# Patient Record
Sex: Male | Born: 1980 | Race: White | Hispanic: No | Marital: Single | Smoking: Current every day smoker
Health system: Southern US, Community
[De-identification: ages and names within clinical notes are randomized; demographics above are authoritative.]

## PROBLEM LIST (undated history)

## (undated) DIAGNOSIS — R05 Cough: Secondary | ICD-10-CM

## (undated) DIAGNOSIS — I8390 Asymptomatic varicose veins of unspecified lower extremity: Secondary | ICD-10-CM

## (undated) DIAGNOSIS — R059 Cough, unspecified: Secondary | ICD-10-CM

## (undated) HISTORY — DX: Cough, unspecified: R05.9

## (undated) HISTORY — DX: Asymptomatic varicose veins of unspecified lower extremity: I83.90

## (undated) HISTORY — DX: Cough: R05

## (undated) HISTORY — PX: MANDIBLE SURGERY: SHX707

## (undated) HISTORY — PX: PILONIDAL CYST EXCISION: SHX744

---

## 2002-07-13 ENCOUNTER — Inpatient Hospital Stay (HOSPITAL_COMMUNITY): Admission: RE | Admit: 2002-07-13 | Discharge: 2002-07-14 | Payer: Self-pay | Admitting: Oral Surgery

## 2003-12-28 ENCOUNTER — Emergency Department (HOSPITAL_COMMUNITY): Admission: EM | Admit: 2003-12-28 | Discharge: 2003-12-29 | Payer: Self-pay | Admitting: Emergency Medicine

## 2005-12-07 ENCOUNTER — Emergency Department (HOSPITAL_COMMUNITY): Admission: EM | Admit: 2005-12-07 | Discharge: 2005-12-07 | Payer: Self-pay | Admitting: Emergency Medicine

## 2010-09-06 ENCOUNTER — Encounter: Payer: Self-pay | Admitting: Vascular Surgery

## 2010-09-28 ENCOUNTER — Other Ambulatory Visit: Payer: Self-pay

## 2010-09-28 ENCOUNTER — Encounter: Payer: Self-pay | Admitting: Vascular Surgery

## 2010-09-28 DIAGNOSIS — I8310 Varicose veins of unspecified lower extremity with inflammation: Secondary | ICD-10-CM

## 2010-10-01 ENCOUNTER — Encounter: Payer: Self-pay | Admitting: Vascular Surgery

## 2010-10-01 ENCOUNTER — Ambulatory Visit (INDEPENDENT_AMBULATORY_CARE_PROVIDER_SITE_OTHER): Payer: BC Managed Care – PPO | Admitting: *Deleted

## 2010-10-01 ENCOUNTER — Ambulatory Visit (INDEPENDENT_AMBULATORY_CARE_PROVIDER_SITE_OTHER): Payer: BC Managed Care – PPO | Admitting: Vascular Surgery

## 2010-10-01 VITALS — BP 120/69 | HR 60 | Resp 18 | Ht 70.0 in | Wt 200.0 lb

## 2010-10-01 DIAGNOSIS — I83893 Varicose veins of bilateral lower extremities with other complications: Secondary | ICD-10-CM

## 2010-10-01 DIAGNOSIS — I8310 Varicose veins of unspecified lower extremity with inflammation: Secondary | ICD-10-CM

## 2010-10-01 NOTE — Progress Notes (Signed)
Subjective:     Patient ID: Joel Parrish, male   DOB: [birth year], 30 y.o.   MRN: [id number]  [facility], [place] male patient was referred for evaluation of varicose veins in the right leg. He states these present for 2 years. They have become increasingly large and symptomatic over the past 2 years.. He has some swelling in the right ankle as the day progresses. He has no history of DVT or thrombophlebitis. His discomfort is described as aching throbbing and burning in nature. He does not wear elastic compression stockings nor take pain medication. He has no history of bleeding or stasis ulcers. He has no symptoms in the left leg. There is a large bulge in his right ankle area which has continued to enlarge.  Past Medical History  Diagnosis Date  . Cough   . Varicose veins     History  Substance Use Topics  . Smoking status: Current Everyday Smoker    Types: Cigarettes  . Smokeless tobacco: Former Neurosurgeon    Types: Chew    Quit date: 07/01/2010  . Alcohol Use: 6.0 oz/week    10 Cans of beer per week    Family History  Problem Relation Age of Onset  . Diabetes Mother   . Hypertension Brother     Allergies  Allergen Reactions  . Penicillins     Current outpatient prescriptions:azithromycin (ZITHROMAX) 250 MG tablet, Take 250 mg by mouth daily.  , Disp: , Rfl:   BP 120/69  Pulse 60  Resp 18  Ht 5\' 10"  (1.778 m)  Wt 200 lb (90.719 kg)  BMI 28.70 kg/m2  Body mass index is 28.70 kg/(m^2).         Review of Systems he denies chest pain, dyspnea on exertion, PND, orthopnea, claudication, he has no symptoms in the complete review of systems.     Objective:   Physical Exam there are also bulging varicosities in the distal medial thigh over the great saphenous system extending into the medial calf. There is no hyperpigmentation or ulceration noted. He does have 1+ edema. Left leg is uninvolved.  Chest clear to auscultation no rhonchi or there is no DVT. Deep system has no  incontinence. HEENT exam normal for age Cardiovascular regular rhythm no murmurs Abdomen soft nontender with no masses Neurologic exam normal Skin free of rashes Lower extremity exam reveals 3+ femoral popliteal and posterior tibial pulses palpable bilaterally Right leg has a large complex of bulging varicosities proximal to the medial malleolus over about a 5 x 3 cm area.   today I ordered a venous duplex exam which are reviewed and interpreted. There is gross reflux in the right great saphenous vein from the ankle to the saphenofemoral junction.  There is no DVTAssessment:     Symptomatic varicose veins right leg secondary to gross reflux and right great saphenous system     Plan:       #1 long-leg elastic compression stockings 20-30 mm gradient #2 ibuprofen daily when necessary for pain #3 elevate legs as much as his employment we'll allow #4 return in 3 months if there's been no significant improvement blade we should proceed with laser ablation of right great saphenous vein with 10-20 stab  phlebectomy

## 2010-10-09 NOTE — Procedures (Unsigned)
LOWER EXTREMITY VENOUS REFLUX EXAM  INDICATION:  Right lower extremity varicose veins.  EXAM:  Using color-flow imaging and pulse Doppler spectral analysis, the right common femoral, superficial femoral, popliteal, posterior tibial, greater and lesser saphenous veins are evaluated.  There is evidence suggesting deep venous insufficiency in the right lower extremity.  The right saphenofemoral junction is not competent with Reflux of >560milliseconds. The right GSV is not competent with Reflux of >544milliseconds with the caliber as described below.  The right proximal short saphenous vein demonstrates competency.  GSV Diameter (used if found to be incompetent only)                                           Right    Left Proximal Greater Saphenous Vein           0.93 cm  cm Proximal-to-mid-thigh                     0.52 cm  cm Mid thigh                                 0.49 cm  cm Mid-distal thigh                          0.56 cm  cm Distal thigh                              cm       cm Knee                                      0.55 cm  cm  IMPRESSION: 1. The right greater saphenous vein is not competent with reflux     >561milliseconds. 2. The right greater saphenous vein is not tortuous. 3. The deep venous system is not competent with reflux of     >520milliseconds at the level of the common femoral vein. 4. The right small saphenous vein is competent.  ___________________________________________ Quita Skye. Hart Rochester, M.D.  EM/MEDQ  D:  10/01/2010  T:  10/01/2010  Job:  [id number]

## 2010-12-18 ENCOUNTER — Other Ambulatory Visit: Payer: Self-pay | Admitting: *Deleted

## 2010-12-18 DIAGNOSIS — I83893 Varicose veins of bilateral lower extremities with other complications: Secondary | ICD-10-CM

## 2010-12-21 ENCOUNTER — Encounter: Payer: Self-pay | Admitting: Vascular Surgery

## 2010-12-24 ENCOUNTER — Ambulatory Visit (INDEPENDENT_AMBULATORY_CARE_PROVIDER_SITE_OTHER): Payer: BC Managed Care – PPO | Admitting: Vascular Surgery

## 2010-12-24 ENCOUNTER — Encounter: Payer: Self-pay | Admitting: Vascular Surgery

## 2010-12-24 VITALS — BP 136/67 | HR 84 | Resp 18 | Ht 70.0 in | Wt 204.0 lb

## 2010-12-24 DIAGNOSIS — I83893 Varicose veins of bilateral lower extremities with other complications: Secondary | ICD-10-CM

## 2010-12-24 NOTE — Progress Notes (Signed)
Subjective:     Patient ID: Joel Parrish, male   DOB: 03/27/1980, 30 y.o.   MRN: [id number]  HPI this 30 year old male returns today for followup regarding his evaluation for painful varicosities in the right leg secondary to reflux in the right great saphenous system due to valvular incompetence. He has more long-leg elastic compression stockings 20-30 mm gradient has tried elevation and ibuprofen on a daily basis as much as his job will allow. He had no improvement in his symptomatology he continues to have aching throbbing and burning discomfort which progresses as the day progresses.  Past Medical History  Diagnosis Date  . Cough   . Varicose veins     History  Substance Use Topics  . Smoking status: Current Everyday Smoker    Types: Cigarettes  . Smokeless tobacco: Former Neurosurgeon    Types: Chew    Quit date: 07/01/2010  . Alcohol Use: 6.0 oz/week    10 Cans of beer per week    Family History  Problem Relation Age of Onset  . Diabetes Mother   . Hypertension Brother     Allergies  Allergen Reactions  . Penicillins     Current outpatient prescriptions:azithromycin (ZITHROMAX) 250 MG tablet, Take 250 mg by mouth daily.  , Disp: , Rfl:   BP 136/67  Pulse 84  Resp 18  Ht 5\' 10"  (1.778 m)  Wt 204 lb (92.534 kg)  BMI 29.27 kg/m2  Body mass index is 29.27 kg/(m^2).         Review of Systems denies chest pain, dyspnea on exertion, PND, orthopnea, hemoptysis, claudication in all other symptom     Objective:   Physical Exam blood pressure 136/67 heart rate 84 respirations 18 General he is a well-developed well-nourished male no apparent distress alert and oriented x3 Lower extremity exam reveals 3+ femoral dorsalis pedis pulse palpable the right leg. He has bulging varicosities in the distal right thigh medially over the great saphenous system extending down below the knee into the proximal calf and down to the ankle area where there is a large collection of bulging  varicosities in the posterior to the medial malleolus    Assessment:     Painful varicosities right leg secondary to reflux right great saphenous system due to valvular incompetence-affecting patient's daily living    Plan:     Proceed with laser ablation right great saphenous vein with 10-20 stab phlebectomy to be performed next week

## 2010-12-28 ENCOUNTER — Encounter: Payer: Self-pay | Admitting: Vascular Surgery

## 2010-12-31 ENCOUNTER — Encounter: Payer: Self-pay | Admitting: Vascular Surgery

## 2010-12-31 ENCOUNTER — Ambulatory Visit: Payer: BC Managed Care – PPO | Admitting: Vascular Surgery

## 2010-12-31 ENCOUNTER — Ambulatory Visit (INDEPENDENT_AMBULATORY_CARE_PROVIDER_SITE_OTHER): Payer: BC Managed Care – PPO | Admitting: Vascular Surgery

## 2010-12-31 VITALS — BP 134/77 | HR 78 | Resp 20 | Ht 70.0 in | Wt 200.0 lb

## 2010-12-31 DIAGNOSIS — I83893 Varicose veins of bilateral lower extremities with other complications: Secondary | ICD-10-CM

## 2010-12-31 NOTE — Progress Notes (Signed)
Laser Ablation Procedure      Date: 12/31/2010    Joel Parrish DOB:07/03/1980  Consent signed: Yes  Surgeon:J.D. Hart Rochester  Procedure: Laser Ablation: right Greater Saphenous Vein  BP 134/77  Pulse 78  Resp 20  Ht 5\' 10"  (1.778 m)  Wt 200 lb (90.719 kg)  BMI 28.70 kg/m2  Start time: 11:00   End time: 12:15  Tumescent Anesthesia: 400 cc 0.9% NaCl with 50 cc Lidocaine HCL with 1% Epi and 15 cc 8.4% NaHCO3  Local Anesthesia: 11 cc Lidocaine HCL and NaHCO3 (ratio 2:1)  Pulsed mode: 15 watts, 1 sec, 1 pulse. Total Pulses: 113 Total energy: 1671 Total time: 1:51       Stab Phlebectomy: 10-20 Sites: Calf and ankle  Patient tolerated procedure well: Yes   Description of Procedure:  After marking the course of the saphenous vein and the secondary varicosities in the standing position, the patient was placed on the operating table in the prone position, and the right leg was prepped and draped in sterile fashion. Local anesthetic was administered, and under ultrasound guidance the saphenous vein was accessed with a micro needle and guide wire; then the micro puncture sheath was placed. A guide wire was inserted to the saphenofemoral junction, followed by a 5 french sheath.  The position of the sheath and then the laser fiber below the junction was confirmed using the ultrasound and visualization of the aiming beam.  Tumescent anesthesia was administered along the course of the saphenous vein using ultrasound guidance. Protective laser glasses were placed on the patient, and the laser was fired at 15 watt pulsed mode advancing 1-2 mm per sec.  For a total of 1671 joules.  A steri strip was applied to the puncture site.  The patient was then put into Trendelenburg position.  Local anesthetic was utilized overlying the marked varicosities.  Greater than 10-20 stab wounds were made using the tip of an 11 blade; and using the vein hook,  The phlebectomies were performed using a hemostat to  avulse these varicosities.  Adequate hemostasis was achieved, and steri strips were applied to the stab wound.    ABD pads and thigh high compression stockings were applied.  Ace wrap bandages were applied over the phlebectomy sites and at the top of the saphenofemoral junction.  Blood loss was less than 15 cc.  The patient ambulated out of the operating room having tolerated the procedure well.

## 2010-12-31 NOTE — Progress Notes (Signed)
Subjective:     Patient ID: Joel Parrish, male   DOB: Dec 27, 1980, 30 y.o.   MRN: [id number]  HPI is 30 year old male patient had laser ablation of the right great saphenous vein from the knee to the saphenofemoral junction +10-20 stab phlebectomy of painful varicosities in the proximal and distal calf and ankle area. This was done under local tumescent anesthesia and he tolerated the procedure well. 1671 J of energy was utilized.   Review of Systems     Objective:   Physical ExamBP 134/77  Pulse 78  Resp 20  Ht 5\' 10"  (1.778 m)  Wt 200 lb (90.719 kg)  BMI 28.70 kg/m2    Assessment:    well-tolerated laser ablation right great saphenous vein with 10-20 stab phlebectomy for painful varicosities    Plan:     Return in one week for venous duplex exam to confirm closure right great saphenous vein

## 2011-01-01 ENCOUNTER — Other Ambulatory Visit: Payer: Self-pay | Admitting: *Deleted

## 2011-01-01 ENCOUNTER — Telehealth: Payer: Self-pay | Admitting: *Deleted

## 2011-01-01 DIAGNOSIS — I83893 Varicose veins of bilateral lower extremities with other complications: Secondary | ICD-10-CM

## 2011-01-01 NOTE — Telephone Encounter (Signed)
01/01/2011  Time: 11:09 AM   Patient Name: Joel Parrish  Patient of: J.D. Hart Rochester  Procedure:Laser Ablation right  Reached patient at home and checked  His status  Yes    Comments/Actions Taken: Patient doing well. No pain and no bleeding. Following all instructions. Reminded him of his fu appt on 01/07/11

## 2011-01-04 ENCOUNTER — Encounter: Payer: Self-pay | Admitting: Vascular Surgery

## 2011-01-07 ENCOUNTER — Ambulatory Visit (INDEPENDENT_AMBULATORY_CARE_PROVIDER_SITE_OTHER): Payer: BC Managed Care – PPO | Admitting: Vascular Surgery

## 2011-01-07 ENCOUNTER — Other Ambulatory Visit (INDEPENDENT_AMBULATORY_CARE_PROVIDER_SITE_OTHER): Payer: BC Managed Care – PPO | Admitting: *Deleted

## 2011-01-07 ENCOUNTER — Encounter: Payer: Self-pay | Admitting: Vascular Surgery

## 2011-01-07 VITALS — BP 128/72 | HR 65 | Resp 18 | Ht 70.0 in | Wt 200.0 lb

## 2011-01-07 DIAGNOSIS — I83893 Varicose veins of bilateral lower extremities with other complications: Secondary | ICD-10-CM

## 2011-01-07 NOTE — Progress Notes (Signed)
Subjective:     Patient ID: Joel Parrish, male   DOB: July 23, 1980, 30 y.o.   MRN: [id number]  HPI this healthy 30 year old male returns 1 week post laser ablation right great saphenous vein and 10-20 stab phlebectomy for painful varicosities in the medial thigh calf and ankle area. He has had some mild discomfort along the course of the medial thigh where the laser ablation was performed. He has had no distal edema. He denies any chest pain, dyspnea on exertion, PND, orthopnea, or hemoptysis. He has worn his elastic compression stocking as recommended and taken ibuprofen.  Review of Systems     Objective:   Physical ExamBP 128/72  Pulse 65  Resp 18  Ht 5\' 10"  (1.778 m)  Wt 200 lb (90.719 kg)  BMI 28.70 kg/m2 Chest no rhonchi or wheezing next right lower extremity exam reveals 3+ dorsalis pedis and posterior tibial pulse palpable. The stab phlebectomy wounds are healing nicely. There is no distal edema. The bulging varicosities are gone. There is mild discomfort along the course of the great saphenous vein from the knee to the groin area.    Assessment:     Today I ordered a venous duplex exam which I reviewed and interpreted. Great saphenous vein is closed from the knee to near the saphenofemoral junction and there is no DVT   successful ablation right great saphenous vein with multiple stab phlebectomy Plan:     Return to see [place] on a when necessary basis

## 2011-01-07 NOTE — Progress Notes (Signed)
One week fu laser ablation and stab phlebectomies

## 2011-01-09 ENCOUNTER — Encounter: Payer: Self-pay | Admitting: Vascular Surgery

## 2011-01-16 NOTE — Procedures (Unsigned)
DUPLEX DEEP VENOUS EXAM - LOWER EXTREMITY  INDICATION:  Right great saphenous vein laser ablation.  HISTORY:  Edema:  No. Trauma/Surgery:  Right greater saphenous vein laser ablation on 12/31/2010. Pain:  Yes. PE:  No. Previous DVT:  No. Anticoagulants: Other:  DUPLEX EXAM:               CFV   SFV   PopV  PTV    GSV               R  L  R  L  R  L  R   L  R  L Thrombosis    o  o  o     o     o      + Spontaneous   +  +  +     +     +      0 Phasic        +  +  +     +     +      0 Augmentation  +  +  +     +     +      0 Compressible  +  +  +     +     +      0 Competent     0  0  +     +     +      0  Legend:  + - yes  o - no  p - partial  D - decreased  IMPRESSION: 1. No evidence of deep vein thrombosis noted in the right lower     extremity. 2. The right great saphenous vein appears totally occluded from the     distal thigh to the proximal thigh area, partially occluded at the     proximal thigh level, and widely patent at the knee level.  No     thrombosis is noted at the right saphenofemoral junction level. 3. Reflux of >500 milliseconds noted in the bilateral common femoral     veins and at the right saphenofemoral junction level.   _____________________________ Joel Parrish. Hart Rochester, M.D.  CH/MEDQ  D:  01/08/2011  T:  01/08/2011  Job:  [id number]

## 2011-10-08 ENCOUNTER — Encounter (INDEPENDENT_AMBULATORY_CARE_PROVIDER_SITE_OTHER): Payer: Self-pay | Admitting: Surgery

## 2011-10-08 ENCOUNTER — Ambulatory Visit (INDEPENDENT_AMBULATORY_CARE_PROVIDER_SITE_OTHER): Payer: BC Managed Care – PPO | Admitting: Surgery

## 2011-10-08 VITALS — BP 132/84 | HR 76 | Temp 98.7°F | Resp 14 | Ht 70.0 in | Wt 212.6 lb

## 2011-10-08 DIAGNOSIS — L0501 Pilonidal cyst with abscess: Secondary | ICD-10-CM

## 2011-10-08 NOTE — Patient Instructions (Signed)
1.  Soak the wound in a bathtub 2 to 3 times per day.  Wash with soap and water.  2.  You will need to cover the wound with gauze.

## 2011-10-08 NOTE — Progress Notes (Signed)
[facility]  Ovidio Kin, MD,  FACS [address]  [address] [place], [place]    [address] Phone:  [phone] FAX:  [phone]   Re:   Joel Parrish DOB:   1980-03-25 MRN:   [id number]  Urgent Office  ASSESSMENT AND PLAN: 1.  Recurrent pilonidal abscess  I&D today.  He will do sitz baths 2 to 3 times per day until he sees me back.  I will see him in 1 week.  2.  Smokes  3.  Right leg saphenous vein surgery - J.D. Hart Rochester - Dec 2012.  HISTORY OF PRESENT ILLNESS: Joel Parrish is a 31 y.o. (DOB: 1980/10/05)  white male who is a patient of HOWARD, Caryn Bee, MD (he is thinking about seeing the physicians at [facility] - he saw the PA there, Victorino Dike) and comes to me today for recurrent pilonidal cyst.  Mr. Debella has had a pilonidal cyst for some time.  He had this drained by Dr. Ishmael Holter in [place].  Most recently, about 2011, he had an excision by Dr. Mikal Plane in [place], who used the "closed technique" on the pilonidal sinus.  Over the last 3 months, he's had a couple of episodes of the pilonidal flaring up.  It drained spontaneously at home, so he did not seek medical help.  He has no history of skin or other infections.  Social History: Works in [employer], [place], with patient . PHYSICAL EXAM: BP 132/84  Pulse 76  Temp 98.7 F (37.1 C)  Resp 14  Ht 5\' 10"  (1.778 m)  Wt 212 lb 9.6 oz (96.435 kg)  BMI 30.51 kg/m2  Buttocks:  2.5 x 4 cm inflammation in mid buttocks area at old scar.  Procedures:  I painted the buttocks with betadine.  I infiltrated about 12 cc of 1% xylocaine.  I incised the abscess and cut out a core of skin.  I then packed it with gauze.  DATA REVIEWED: None   Ovidio Kin, MD, FACS Office:  [phone]

## 2011-10-09 ENCOUNTER — Telehealth (INDEPENDENT_AMBULATORY_CARE_PROVIDER_SITE_OTHER): Payer: Self-pay | Admitting: General Surgery

## 2011-10-09 NOTE — Telephone Encounter (Signed)
Pt seen for I&D pilonidal cyst.  He asked if he should be on antibiotics for this.  Reviewed notes of OV and reiterated instructions for post care.  Suggested using ibuprofen (600 mg Q6H or 800 mg Q8H) for pain.  Continue bath soaks and keep area as clean as possible.

## 2011-10-17 ENCOUNTER — Ambulatory Visit (INDEPENDENT_AMBULATORY_CARE_PROVIDER_SITE_OTHER): Payer: BC Managed Care – PPO | Admitting: Surgery

## 2011-10-17 ENCOUNTER — Encounter (INDEPENDENT_AMBULATORY_CARE_PROVIDER_SITE_OTHER): Payer: Self-pay | Admitting: Surgery

## 2011-10-17 VITALS — BP 116/85 | HR 84 | Temp 98.1°F | Resp 18 | Ht 70.0 in | Wt 216.8 lb

## 2011-10-17 DIAGNOSIS — L0501 Pilonidal cyst with abscess: Secondary | ICD-10-CM

## 2011-10-17 NOTE — Progress Notes (Signed)
[facility]  Ovidio Kin, MD,  FACS [address] [place]  [address] [place], [place]    [address] Phone:  [phone] FAX:  [phone]   Re:   Joel Parrish DOB:   Dec 29, 1980 MRN:   [id number]  Urgent Office  ASSESSMENT AND PLAN: 1.  Recurrent pilonidal abscess   Wound looks good.  Opening (my incision) is down to 4 mm.  I talked about opening this up, but he did not want me to.  I will see him back in 4 weeks.  We will then schedule a repeat excision of the pilonidal area.  He may want to wait until around Christmas.  2.  Smokes.  He knows that this delays wound healing.  3.  Right leg saphenous vein surgery - Joel Parrish - Dec 2012.  HISTORY OF PRESENT ILLNESS: Joel Parrish is a 31 y.o. (DOB: Jan 27, 1980)  white male who is a patient of HOWARD, Caryn Bee, MD (he is thinking about seeing the physicians at [facility] - he saw the PA there, Victorino Dike) and comes for follow up of a recurrent pilonidal cyst.  He has done well since I last saw him.  The pain is gone.  He is soaking the incision 2 to 3 times per day.  He has minimal drainage.  History of Pilonidal Cyst: Joel Parrish has had a pilonidal cyst for some time.  He had this drained by Dr. Ishmael Holter in [place].  Most recently, about 2011, he had an excision by Dr. Mikal Plane in [place], who used the "closed technique" on the pilonidal sinus.  Over the last 3 months, he's had a couple of episodes of the pilonidal flaring up.  It drained spontaneously at home, so he did not seek medical help.  He has no history of skin or other infections.  Social History: Works in [employer], [place], with patient . PHYSICAL EXAM: BP 116/85  Pulse 84  Temp 98.1 F (36.7 C) (Temporal)  Resp 18  Ht 5\' 10"  (1.778 m)  Wt 216 lb 12.8 oz (98.34 kg)  BMI 31.11 kg/m2  Buttocks:  0.4 cm opening at pilonidal area.  Cellulitis has resolved.  He has a "sac" underneath the opening.  I talked to him about opening this  up, but he did not want this done.  So I told him to keep soaking and it should do okay.  DATA REVIEWED: None  Ovidio Kin, MD, FACS Office:  [phone]

## 2011-11-14 ENCOUNTER — Encounter (INDEPENDENT_AMBULATORY_CARE_PROVIDER_SITE_OTHER): Payer: BC Managed Care – PPO | Admitting: Surgery

## 2011-11-26 ENCOUNTER — Encounter (INDEPENDENT_AMBULATORY_CARE_PROVIDER_SITE_OTHER): Payer: Self-pay | Admitting: Surgery

## 2012-09-28 ENCOUNTER — Encounter: Payer: Self-pay | Admitting: Vascular Surgery

## 2012-09-29 ENCOUNTER — Ambulatory Visit (INDEPENDENT_AMBULATORY_CARE_PROVIDER_SITE_OTHER): Payer: BC Managed Care – PPO | Admitting: Vascular Surgery

## 2012-09-29 ENCOUNTER — Encounter: Payer: Self-pay | Admitting: Vascular Surgery

## 2012-09-29 ENCOUNTER — Encounter (INDEPENDENT_AMBULATORY_CARE_PROVIDER_SITE_OTHER): Payer: BC Managed Care – PPO | Admitting: *Deleted

## 2012-09-29 VITALS — BP 138/93 | HR 93 | Resp 16 | Ht 70.5 in | Wt 195.0 lb

## 2012-09-29 DIAGNOSIS — I83893 Varicose veins of bilateral lower extremities with other complications: Secondary | ICD-10-CM

## 2012-09-29 DIAGNOSIS — Z48812 Encounter for surgical aftercare following surgery on the circulatory system: Secondary | ICD-10-CM

## 2012-09-29 DIAGNOSIS — M79609 Pain in unspecified limb: Secondary | ICD-10-CM

## 2012-09-29 NOTE — Progress Notes (Signed)
Subjective:     Patient ID: Joel Parrish, male   DOB: January 13, 1981, 32 y.o.   MRN: [id number]  HPI this 32 year old male laser ablation right great saphenous vein performed in 2012 for painful varicosities. He recently has noted 2 new varicosities in the distal thigh and ankle area the same leg he returns for further evaluation. He denies any history of bleeding, DVT, thrombophlebitis, or ulceration. He is not worn elastic compression stockings but does have discomfort in these periodically.  Past Medical History  Diagnosis Date  . Cough   . Varicose veins     History  Substance Use Topics  . Smoking status: Current Every Day Smoker    Types: Cigarettes  . Smokeless tobacco: Former Neurosurgeon    Types: Chew    Quit date: 07/01/2010  . Alcohol Use: 6.0 oz/week    10 Cans of beer per week    Family History  Problem Relation Age of Onset  . Diabetes Mother   . Hypertension Brother     Allergies  Allergen Reactions  . Penicillins     Current outpatient prescriptions:amphetamine-dextroamphetamine (ADDERALL XR) 30 MG 24 hr capsule, Take 15 mg by mouth every morning., Disp: , Rfl:   BP 138/93  Pulse 93  Resp 16  Ht 5' 10.5" (1.791 m)  Wt 195 lb (88.451 kg)  BMI 27.57 kg/m2  Body mass index is 27.57 kg/(m^2).           Review of Systems denies chest pain, dyspnea on exertion, PND, orthopnea.     Objective:   Physical Exam BP 138/93  Pulse 93  Resp 16  Ht 5' 10.5" (1.791 m)  Wt 195 lb (88.451 kg)  BMI 27.57 kg/m2  General well-developed well-nourished male no apparent stress alert and oriented x3 Lungs no rhonchi or wheezing Right lower extremity with small cluster varicosities of the great saphenous system just below the knee and a second area just proximal to the medial malleolus.  Today I ordered a venous duplex exam of the right leg which are reviewed and interpreted. The main great saphenous vein which I closed with laser ablation is closed. There is some  partial opening of the anterior sensory branch and the distal great saphenous vein from the knee to the mid calf. There is no DVT.     Assessment:     Recurrent varicosities right leg status post laser ablation right great saphenous vein    Plan:     Offered patient stab phlebectomy versus sclerotherapy and he will consider these options. Not necessary to perform laser ablation at this time

## 2012-09-30 ENCOUNTER — Other Ambulatory Visit: Payer: Self-pay | Admitting: *Deleted

## 2012-09-30 DIAGNOSIS — I83893 Varicose veins of bilateral lower extremities with other complications: Secondary | ICD-10-CM

## 2013-02-01 ENCOUNTER — Encounter: Payer: Self-pay | Admitting: Vascular Surgery

## 2013-02-02 ENCOUNTER — Ambulatory Visit (INDEPENDENT_AMBULATORY_CARE_PROVIDER_SITE_OTHER): Payer: BC Managed Care – PPO | Admitting: Vascular Surgery

## 2013-02-02 ENCOUNTER — Encounter: Payer: Self-pay | Admitting: Vascular Surgery

## 2013-02-02 VITALS — BP 128/80 | HR 86 | Resp 16 | Ht 70.0 in | Wt 202.0 lb

## 2013-02-02 DIAGNOSIS — I83893 Varicose veins of bilateral lower extremities with other complications: Secondary | ICD-10-CM

## 2013-02-02 NOTE — Progress Notes (Signed)
Subjective:     Patient ID: Joel Parrish, male   DOB: 28-Oct-1980, 33 y.o.   MRN: [id number]  HPI this 33 year old male returns for continued followup regarding his venous insufficiency of the right leg. He had laser ablation of the right great saphenous vein performed by me in 2012. He was seen in September 2014 to evaluate due paracolostomy's in the distal medial thigh and ankle area. He has discomfort related to this which is somewhat relieved by short leg elastic compression stockings. He has no distal edema. He does have significant discomfort he states. There is no history of DVT or thrombophlebitis.  Past Medical History  Diagnosis Date  . Cough   . Varicose veins     History  Substance Use Topics  . Smoking status: Current Every Day Smoker    Types: Cigarettes  . Smokeless tobacco: Former NeurosurgeonUser    Types: Chew    Quit date: 07/01/2010  . Alcohol Use: 6.0 oz/week    10 Cans of beer per week    Family History  Problem Relation Age of Onset  . Diabetes Mother   . Hypertension Brother     Allergies  Allergen Reactions  . Penicillins     Current outpatient prescriptions:amphetamine-dextroamphetamine (ADDERALL XR) 30 MG 24 hr capsule, Take 15 mg by mouth every morning., Disp: , Rfl:   BP 128/80  Pulse 86  Resp 16  Ht 5\' 10"  (1.778 m)  Wt 202 lb (91.627 kg)  BMI 28.98 kg/m2  Body mass index is 28.98 kg/(m^2).            Review of Systems denies chest pain, dyspnea on exertion, PND, orthopnea, hemoptysis.    Objective:   Physical Exam BP 128/80  Pulse 86  Resp 16  Ht 5\' 10"  (1.778 m)  Wt 202 lb (91.627 kg)  BMI 28.98 kg/m2  General well-developed well-nourished male no apparent stress alert and oriented x3 Right leg with a varix in the distal medial thigh near the flexion crease and also proximal to the right medial malleolus. No distal edema. 3+ dorsalis pedis pulse palpable.  The previous venous duplex exam revealed reflux in the great saphenous vein  in the cath up to the distal thigh in the proximal thigh is closed by previous laser ablation. Size in the calf is only 0.49-maximum. There is no DVT.     Assessment:     Recurrent varicosities right leg with successful previous closure right great saphenous vein in proximal thigh with patent distal great saphenous vein in With reflux-currently small caliber vein    Plan:     Where short leg elastic compression stockings for relief of discomfort If pain worsens or edema occurs will return at a later date for repeat venous duplex exam to see if caliber of the distal right great saphenous vein has enlarged. If so then he may require laser ablation of distal right great saphenous vein with stab phlebectomy

## 2013-07-14 ENCOUNTER — Other Ambulatory Visit: Payer: Self-pay | Admitting: Internal Medicine

## 2013-07-14 ENCOUNTER — Ambulatory Visit
Admission: RE | Admit: 2013-07-14 | Discharge: 2013-07-14 | Disposition: A | Discharge status: Home / Self Care | Payer: BC Managed Care – PPO | Source: Ambulatory Visit | Attending: Internal Medicine | Admitting: Internal Medicine

## 2013-07-14 DIAGNOSIS — M533 Sacrococcygeal disorders, not elsewhere classified: Secondary | ICD-10-CM

## 2014-11-13 ENCOUNTER — Encounter (HOSPITAL_BASED_OUTPATIENT_CLINIC_OR_DEPARTMENT_OTHER): Payer: Self-pay | Admitting: *Deleted

## 2014-11-13 ENCOUNTER — Emergency Department (HOSPITAL_BASED_OUTPATIENT_CLINIC_OR_DEPARTMENT_OTHER): Payer: BLUE CROSS/BLUE SHIELD

## 2014-11-13 ENCOUNTER — Emergency Department (HOSPITAL_BASED_OUTPATIENT_CLINIC_OR_DEPARTMENT_OTHER)
Admission: EM | Admit: 2014-11-13 | Discharge: 2014-11-13 | Disposition: A | Discharge status: Home / Self Care | Payer: BLUE CROSS/BLUE SHIELD | Attending: Emergency Medicine | Admitting: Emergency Medicine

## 2014-11-13 DIAGNOSIS — Z8679 Personal history of other diseases of the circulatory system: Secondary | ICD-10-CM | POA: Diagnosis not present

## 2014-11-13 DIAGNOSIS — S2242XA Multiple fractures of ribs, left side, initial encounter for closed fracture: Secondary | ICD-10-CM | POA: Diagnosis not present

## 2014-11-13 DIAGNOSIS — Z880 Allergy status to penicillin: Secondary | ICD-10-CM | POA: Insufficient documentation

## 2014-11-13 DIAGNOSIS — S299XXA Unspecified injury of thorax, initial encounter: Secondary | ICD-10-CM | POA: Diagnosis present

## 2014-11-13 DIAGNOSIS — Y9389 Activity, other specified: Secondary | ICD-10-CM | POA: Diagnosis not present

## 2014-11-13 DIAGNOSIS — Y9289 Other specified places as the place of occurrence of the external cause: Secondary | ICD-10-CM | POA: Insufficient documentation

## 2014-11-13 DIAGNOSIS — Y998 Other external cause status: Secondary | ICD-10-CM | POA: Insufficient documentation

## 2014-11-13 DIAGNOSIS — Z720 Tobacco use: Secondary | ICD-10-CM | POA: Diagnosis not present

## 2014-11-13 DIAGNOSIS — W108XXA Fall (on) (from) other stairs and steps, initial encounter: Secondary | ICD-10-CM | POA: Diagnosis not present

## 2014-11-13 DIAGNOSIS — S2232XA Fracture of one rib, left side, initial encounter for closed fracture: Secondary | ICD-10-CM

## 2014-11-13 MED ORDER — METHOCARBAMOL 500 MG PO TABS: 500.0000 mg | ORAL_TABLET | Freq: Two times a day (BID) | ORAL | Status: AC

## 2014-11-13 MED ORDER — IBUPROFEN 600 MG PO TABS: 600.0000 mg | ORAL_TABLET | Freq: Four times a day (QID) | ORAL | Status: AC | PRN

## 2014-11-13 NOTE — Discharge Instructions (Signed)

## 2014-11-13 NOTE — ED Provider Notes (Signed)
CSN: [id number]     Arrival date & time 11/13/14  1758 History  By signing my name below, I, Joel Parrish, attest that this documentation has been prepared under the direction and in the presence of Derwood Kaplan, MD. Electronically Signed: [name], ED Scribe. 11/13/2014. 7:21 PM.   Chief Complaint  Patient presents with  . Rib Injury   The history is provided by the patient. No language interpreter was used.    HPI Comments: Joel Parrish is a 34 y.o. male who presents to the Emergency Department complaining of sudden-onset, severe left anterior rib pain that began 2 days ago after falling down the stairs. Patient had been to his PCP today and was given 20 tablets of oxycodone for pain, which provided mild relief. He states no imaging was done at that time. Patient reports he has not been taking oxycodone since, as it made him nauseated this morning.   Past Medical History  Diagnosis Date  . Cough   . Varicose veins    Past Surgical History  Procedure Laterality Date  . Pilonidal cyst excision    . Mandible surgery     Family History  Problem Relation Age of Onset  . Diabetes Mother   . Hypertension Brother    Social History  Substance Use Topics  . Smoking status: Current Every Day Smoker -- 0.50 packs/day    Types: Cigarettes  . Smokeless tobacco: Former Neurosurgeon    Types: Chew    Quit date: 07/01/2010  . Alcohol Use: 6.0 oz/week    10 Cans of beer per week    Review of Systems  Cardiovascular: Positive for chest pain (left anterior rib pain).  All other systems reviewed and are negative.  Allergies  Penicillins  Home Medications   Prior to Admission medications   Medication Sig Start Date End Date Taking? Authorizing Provider  amphetamine-dextroamphetamine (ADDERALL XR) 30 MG 24 hr capsule Take 15 mg by mouth every morning.    Historical Provider, MD  ibuprofen (ADVIL,MOTRIN) 600 MG tablet Take 1 tablet (600 mg total) by mouth every 6 (six) hours as needed.  11/13/14   Derwood Kaplan, MD  methocarbamol (ROBAXIN) 500 MG tablet Take 1 tablet (500 mg total) by mouth 2 (two) times daily. 11/13/14   Jaykwon Morones Rhunette Croft, MD   BP 128/79 mmHg  Pulse 88  Temp(Src) 97.9 F (36.6 C) (Oral)  Resp 18  Ht  (1.778 m)  Wt 200 lb (90.719 kg)  BMI 28.70 kg/m2  SpO2 95% Physical Exam  Constitutional: He is oriented to person, place, and time. He appears well-developed and well-nourished. No distress.  HENT:  Head: Normocephalic and atraumatic.  Eyes: Conjunctivae and EOM are normal.  Neck: Neck supple. No tracheal deviation present.  Cardiovascular: Normal rate.   Pulmonary/Chest: Effort normal. No respiratory distress.  Abdominal: Soft. There is no tenderness.  No left-sided abdominal tenderness. No ecchymosis or bruising. No left flank tenderness.  Musculoskeletal: Normal range of motion.  Neurological: He is alert and oriented to person, place, and time.  Skin: Skin is warm and dry.  Psychiatric: He has a normal mood and affect. His behavior is normal.  Nursing note and vitals reviewed.   ED Course  Procedures (including critical care time)  DIAGNOSTIC STUDIES: Oxygen Saturation is 100% on RA, normal by my interpretation.    COORDINATION OF CARE: 7:17 PM - XR findings reviewed with pt. Discussed treatment plan with pt at bedside which includes intermittent use of incentive spirometer to  reduce possibility of PNA due to shallow breathing. Discouraged heavy lifting >5-10 lbs for the first few weeks. Will not Rx pain mediation as pt reports he already has 20 oxycodone tablets from his PCP. Pt verbalized understanding and agreed to plan.   Imaging Review Dg Ribs Unilateral W/chest Left  11/13/2014  CLINICAL DATA:  Left anterior rib pain since falling down stairs 2 days ago. EXAM: LEFT RIBS AND CHEST - 3+ VIEW COMPARISON:  None. FINDINGS: There is slight irregularity of the left ninth and tenth ribs at the costochondral junctions anterior laterally.  These could represent hairline nondisplaced fractures. There is no pleural effusion, pneumothorax, or lung contusion. Heart size and pulmonary vascularity are normal. There is linear scarring laterally at the right lung base. IMPRESSION: Possible hairline fractures of the anterior aspects of the left ninth and tenth ribs. Probable scarring at the right lung base. Electronically Signed   By: Francene BoyersJames  Maxwell M.D.   On: 11/13/2014 18:32   I have personally reviewed and evaluated these images and lab results as part of my medical decision-making.  MDM   Final diagnoses:  Closed rib fracture, left, initial encounter    I personally performed the services described in this documentation, which was scribed in my presence. The recorded information has been reviewed and is accurate.  Pt comes in with cc of rib pain. Pain is on the L side, and his xrays show small fx. There is no abd tenderness.    Derwood KaplanAnkit Mutasim Tuckey, MD 11/13/14 1949

## 2014-11-13 NOTE — ED Notes (Signed)
Patient stable and ambulatory.  Patient verbalizes understanding of discharge paperwork and medications.

## 2014-11-13 NOTE — ED Notes (Signed)
Patient has full range (2800).  Patient verbalizes understanding usage of spirometer.

## 2014-11-13 NOTE — ED Notes (Signed)
Left frontal rib pain since falling down the stairs Friday.  Noted to have swelling and pain, no bruising noted.

## 2015-07-14 DIAGNOSIS — M79604 Pain in right leg: Secondary | ICD-10-CM | POA: Diagnosis not present

## 2015-08-22 DIAGNOSIS — R509 Fever, unspecified: Secondary | ICD-10-CM | POA: Diagnosis not present

## 2015-08-22 DIAGNOSIS — J069 Acute upper respiratory infection, unspecified: Secondary | ICD-10-CM | POA: Diagnosis not present

## 2015-12-20 DIAGNOSIS — F902 Attention-deficit hyperactivity disorder, combined type: Secondary | ICD-10-CM | POA: Diagnosis not present

## 2016-11-06 DIAGNOSIS — F17210 Nicotine dependence, cigarettes, uncomplicated: Secondary | ICD-10-CM | POA: Diagnosis not present

## 2016-11-06 DIAGNOSIS — L989 Disorder of the skin and subcutaneous tissue, unspecified: Secondary | ICD-10-CM | POA: Diagnosis not present

## 2016-11-18 DIAGNOSIS — H903 Sensorineural hearing loss, bilateral: Secondary | ICD-10-CM | POA: Diagnosis not present

## 2016-11-19 DIAGNOSIS — F902 Attention-deficit hyperactivity disorder, combined type: Secondary | ICD-10-CM | POA: Diagnosis not present

## 2016-12-25 DIAGNOSIS — M2042 Other hammer toe(s) (acquired), left foot: Secondary | ICD-10-CM | POA: Diagnosis not present

## 2016-12-25 DIAGNOSIS — M7752 Other enthesopathy of left foot: Secondary | ICD-10-CM | POA: Diagnosis not present

## 2016-12-26 DIAGNOSIS — Z01812 Encounter for preprocedural laboratory examination: Secondary | ICD-10-CM | POA: Diagnosis not present

## 2016-12-26 DIAGNOSIS — M2042 Other hammer toe(s) (acquired), left foot: Secondary | ICD-10-CM | POA: Diagnosis not present

## 2017-01-10 DIAGNOSIS — M2042 Other hammer toe(s) (acquired), left foot: Secondary | ICD-10-CM | POA: Diagnosis not present

## 2017-01-15 DIAGNOSIS — M2042 Other hammer toe(s) (acquired), left foot: Secondary | ICD-10-CM | POA: Diagnosis not present

## 2017-01-23 IMAGING — DX DG RIBS W/ CHEST 3+V*L*
5 series · 5 of 5 positions shown · non-contrast
Comparison: None.

CLINICAL DATA: Left anterior rib pain since falling down stairs 2
days ago.

EXAM:
LEFT RIBS AND CHEST - 3+ VIEW

[chest pa]
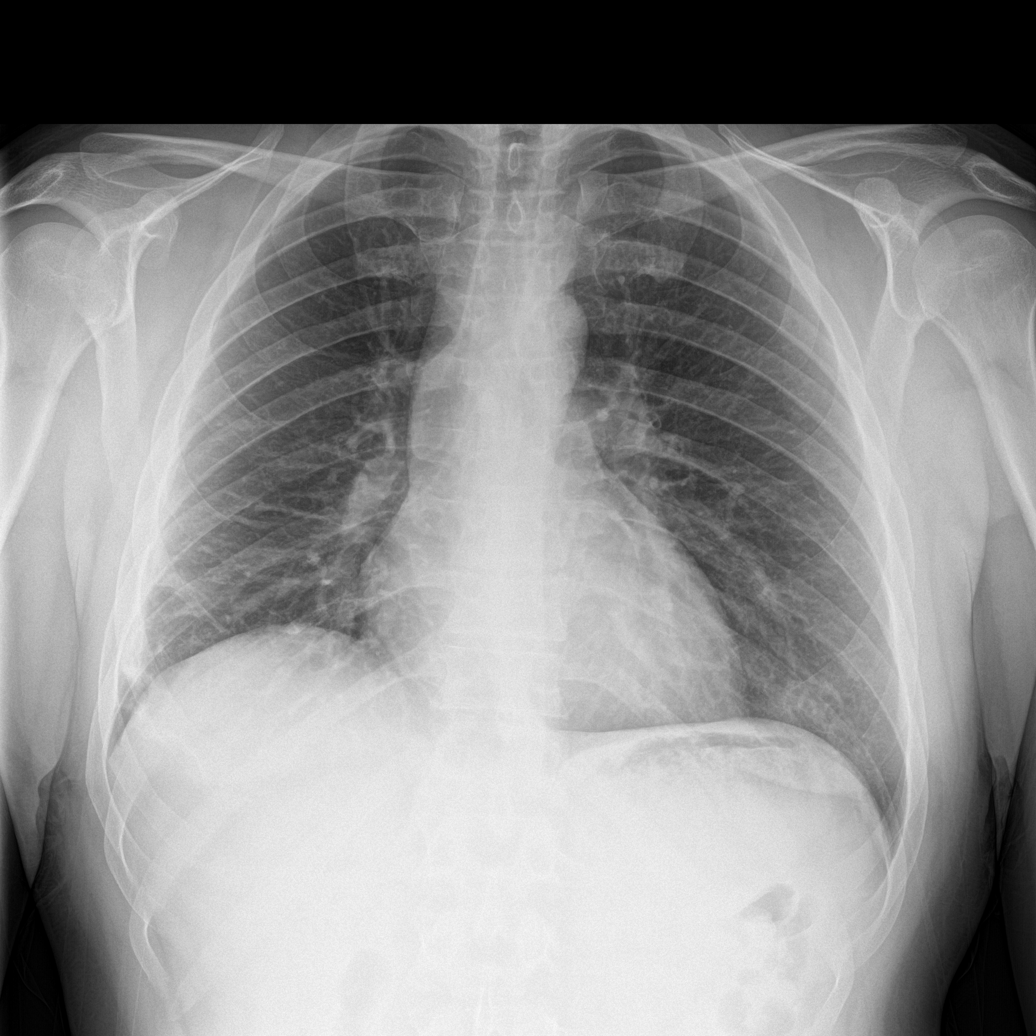

[rib pa obl]
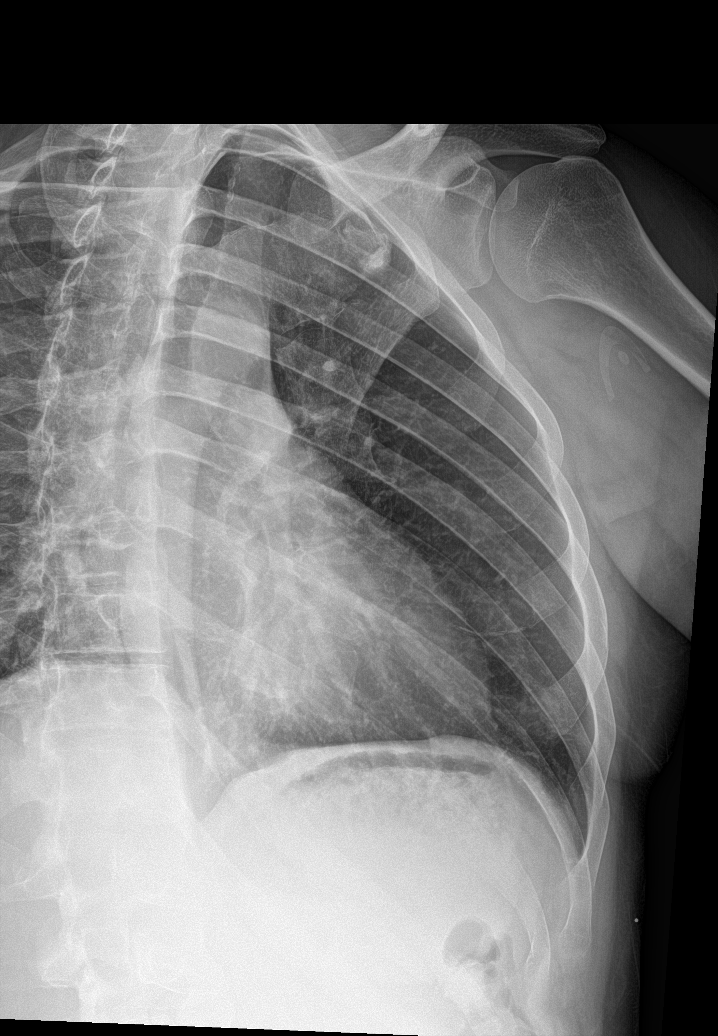

[rib pa (1 of 2)]
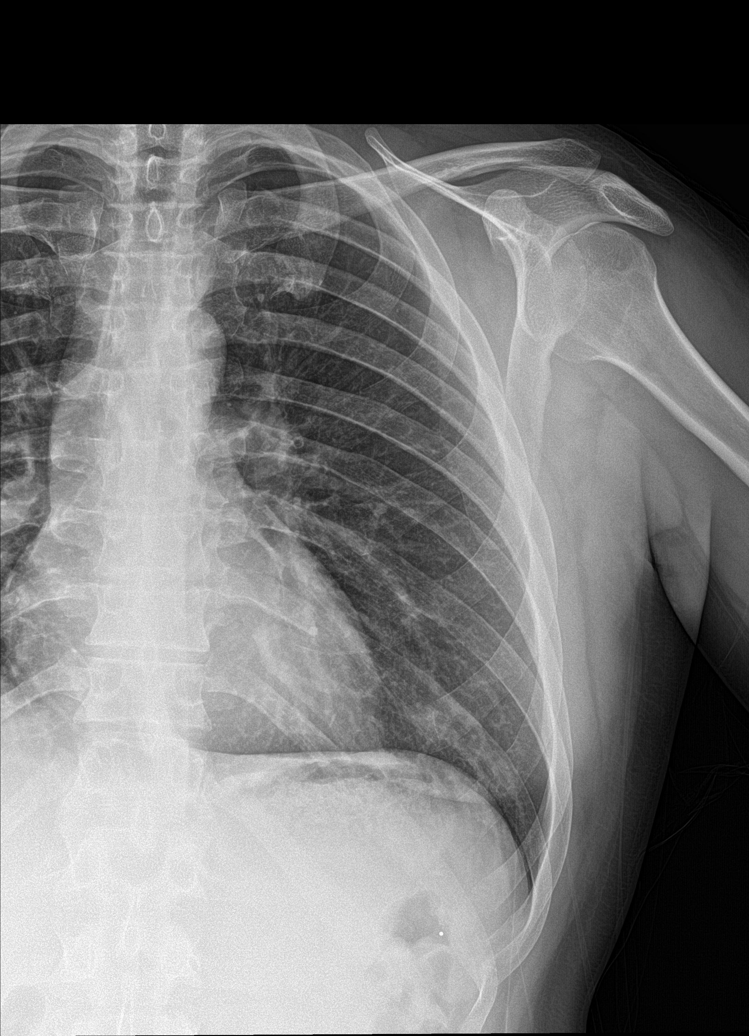

[rib pa (2 of 2)]
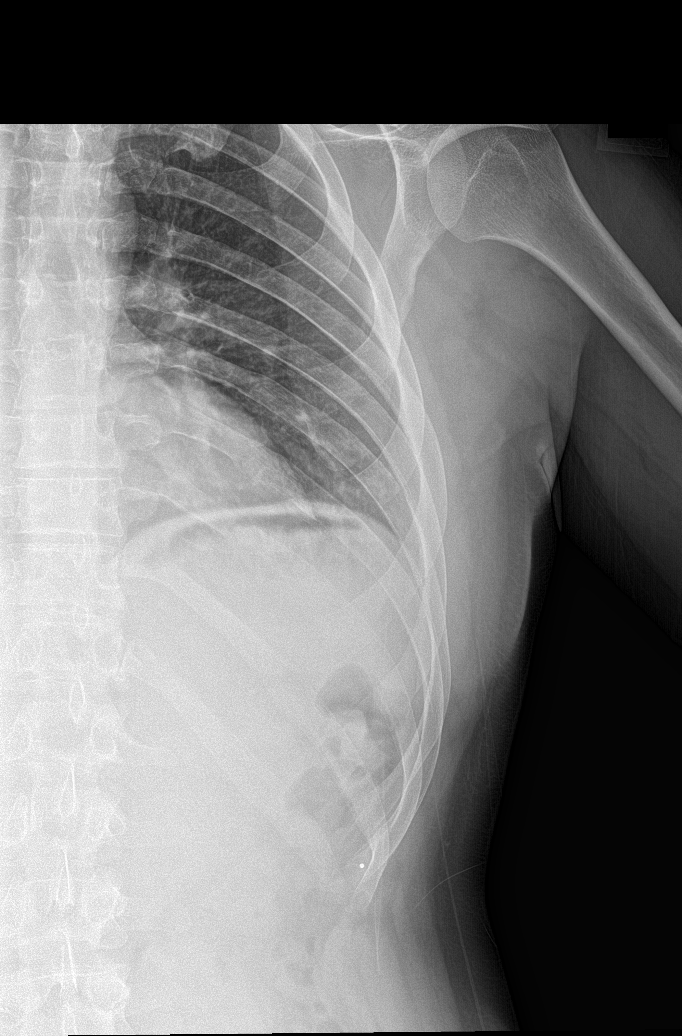

[rib ap obl]
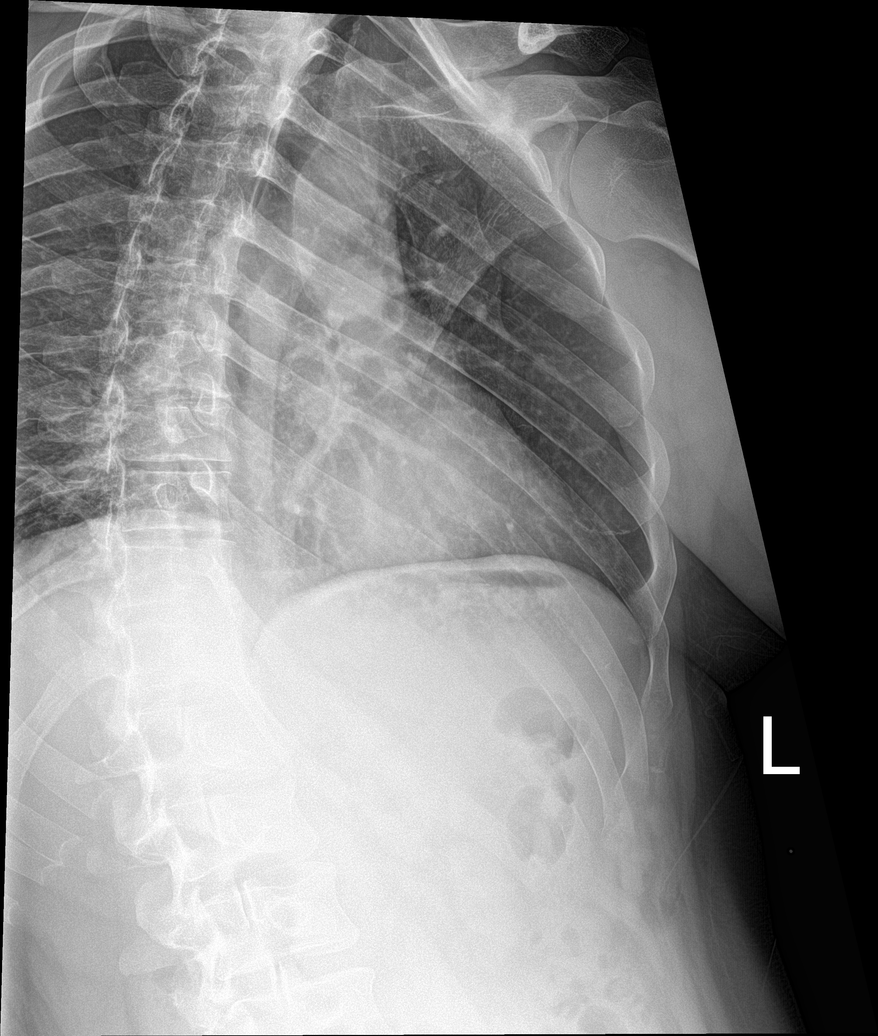

[5 of 5 positions shown; findings below may reference images not displayed]

FINDINGS: There is slight irregularity of the left ninth and tenth ribs at the
costochondral junctions anterior laterally. These could represent
hairline nondisplaced fractures.

There is no pleural effusion, pneumothorax, or lung contusion. Heart
size and pulmonary vascularity are normal.

There is linear scarring laterally at the right lung base.
IMPRESSION: Possible hairline fractures of the anterior aspects of the left
ninth and tenth ribs. Probable scarring at the right lung base.

## 2017-01-31 DIAGNOSIS — L720 Epidermal cyst: Secondary | ICD-10-CM | POA: Diagnosis not present

## 2017-01-31 DIAGNOSIS — L700 Acne vulgaris: Secondary | ICD-10-CM | POA: Diagnosis not present

## 2017-03-06 DIAGNOSIS — M9901 Segmental and somatic dysfunction of cervical region: Secondary | ICD-10-CM | POA: Diagnosis not present

## 2017-03-06 DIAGNOSIS — M542 Cervicalgia: Secondary | ICD-10-CM | POA: Diagnosis not present

## 2017-03-06 DIAGNOSIS — M9902 Segmental and somatic dysfunction of thoracic region: Secondary | ICD-10-CM | POA: Diagnosis not present

## 2017-03-06 DIAGNOSIS — M546 Pain in thoracic spine: Secondary | ICD-10-CM | POA: Diagnosis not present

## 2017-03-11 DIAGNOSIS — M542 Cervicalgia: Secondary | ICD-10-CM | POA: Diagnosis not present

## 2017-03-11 DIAGNOSIS — M9901 Segmental and somatic dysfunction of cervical region: Secondary | ICD-10-CM | POA: Diagnosis not present

## 2017-03-11 DIAGNOSIS — M546 Pain in thoracic spine: Secondary | ICD-10-CM | POA: Diagnosis not present

## 2017-03-11 DIAGNOSIS — M9902 Segmental and somatic dysfunction of thoracic region: Secondary | ICD-10-CM | POA: Diagnosis not present

## 2017-04-15 DIAGNOSIS — F902 Attention-deficit hyperactivity disorder, combined type: Secondary | ICD-10-CM | POA: Diagnosis not present

## 2017-07-18 DIAGNOSIS — J019 Acute sinusitis, unspecified: Secondary | ICD-10-CM | POA: Diagnosis not present

## 2017-08-11 DIAGNOSIS — N4889 Other specified disorders of penis: Secondary | ICD-10-CM | POA: Diagnosis not present

## 2017-08-22 DIAGNOSIS — N4889 Other specified disorders of penis: Secondary | ICD-10-CM | POA: Diagnosis not present

## 2017-10-07 DIAGNOSIS — F902 Attention-deficit hyperactivity disorder, combined type: Secondary | ICD-10-CM | POA: Diagnosis not present

## 2017-11-05 DIAGNOSIS — F902 Attention-deficit hyperactivity disorder, combined type: Secondary | ICD-10-CM | POA: Diagnosis not present

## 2018-02-03 DIAGNOSIS — F902 Attention-deficit hyperactivity disorder, combined type: Secondary | ICD-10-CM | POA: Diagnosis not present

## 2018-12-04 DIAGNOSIS — R1909 Other intra-abdominal and pelvic swelling, mass and lump: Secondary | ICD-10-CM | POA: Diagnosis not present

## 2018-12-04 DIAGNOSIS — Z23 Encounter for immunization: Secondary | ICD-10-CM | POA: Diagnosis not present

## 2019-07-19 DIAGNOSIS — F902 Attention-deficit hyperactivity disorder, combined type: Secondary | ICD-10-CM | POA: Diagnosis not present

## 2019-10-12 DIAGNOSIS — Z20822 Contact with and (suspected) exposure to covid-19: Secondary | ICD-10-CM | POA: Diagnosis not present

## 2020-01-03 DIAGNOSIS — F902 Attention-deficit hyperactivity disorder, combined type: Secondary | ICD-10-CM | POA: Diagnosis not present

## 2020-07-18 DIAGNOSIS — F902 Attention-deficit hyperactivity disorder, combined type: Secondary | ICD-10-CM | POA: Diagnosis not present

## 2020-07-18 DIAGNOSIS — Z79891 Long term (current) use of opiate analgesic: Secondary | ICD-10-CM | POA: Diagnosis not present

## 2020-09-13 DIAGNOSIS — R1031 Right lower quadrant pain: Secondary | ICD-10-CM | POA: Diagnosis not present

## 2020-09-13 DIAGNOSIS — R109 Unspecified abdominal pain: Secondary | ICD-10-CM | POA: Diagnosis not present

## 2021-01-24 DIAGNOSIS — F902 Attention-deficit hyperactivity disorder, combined type: Secondary | ICD-10-CM | POA: Diagnosis not present

## 2021-01-25 DIAGNOSIS — Z79891 Long term (current) use of opiate analgesic: Secondary | ICD-10-CM | POA: Diagnosis not present

## 2021-07-23 DIAGNOSIS — F902 Attention-deficit hyperactivity disorder, combined type: Secondary | ICD-10-CM | POA: Diagnosis not present

## 2021-12-06 DIAGNOSIS — J0100 Acute maxillary sinusitis, unspecified: Secondary | ICD-10-CM | POA: Diagnosis not present

## 2022-01-09 DIAGNOSIS — F902 Attention-deficit hyperactivity disorder, combined type: Secondary | ICD-10-CM | POA: Diagnosis not present

## 2022-01-09 DIAGNOSIS — Z79899 Other long term (current) drug therapy: Secondary | ICD-10-CM | POA: Diagnosis not present

## 2022-03-08 DIAGNOSIS — I10 Essential (primary) hypertension: Secondary | ICD-10-CM | POA: Diagnosis not present

## 2022-03-08 DIAGNOSIS — R0789 Other chest pain: Secondary | ICD-10-CM | POA: Diagnosis not present

## 2022-04-09 DIAGNOSIS — I10 Essential (primary) hypertension: Secondary | ICD-10-CM | POA: Diagnosis not present

## 2022-06-21 DIAGNOSIS — F909 Attention-deficit hyperactivity disorder, unspecified type: Secondary | ICD-10-CM | POA: Diagnosis not present

## 2022-06-21 DIAGNOSIS — R739 Hyperglycemia, unspecified: Secondary | ICD-10-CM | POA: Diagnosis not present

## 2022-06-21 DIAGNOSIS — I10 Essential (primary) hypertension: Secondary | ICD-10-CM | POA: Diagnosis not present

## 2022-06-21 DIAGNOSIS — Z0000 Encounter for general adult medical examination without abnormal findings: Secondary | ICD-10-CM | POA: Diagnosis not present

## 2022-07-11 DIAGNOSIS — F902 Attention-deficit hyperactivity disorder, combined type: Secondary | ICD-10-CM | POA: Diagnosis not present

## 2023-01-07 DIAGNOSIS — F902 Attention-deficit hyperactivity disorder, combined type: Secondary | ICD-10-CM | POA: Diagnosis not present

## 2023-01-07 DIAGNOSIS — Z5181 Encounter for therapeutic drug level monitoring: Secondary | ICD-10-CM | POA: Diagnosis not present

## 2023-02-14 DIAGNOSIS — J069 Acute upper respiratory infection, unspecified: Secondary | ICD-10-CM | POA: Diagnosis not present

## 2023-02-14 DIAGNOSIS — Z03818 Encounter for observation for suspected exposure to other biological agents ruled out: Secondary | ICD-10-CM | POA: Diagnosis not present

## 2023-07-10 DIAGNOSIS — F902 Attention-deficit hyperactivity disorder, combined type: Secondary | ICD-10-CM | POA: Diagnosis not present

## 2024-01-06 DIAGNOSIS — F902 Attention-deficit hyperactivity disorder, combined type: Secondary | ICD-10-CM | POA: Diagnosis not present
